# Patient Record
Sex: Female | Born: 1965 | Hispanic: Yes | Marital: Single
Health system: Southern US, Community
[De-identification: ages and names within clinical notes are randomized; demographics above are authoritative.]

---

## 2004-04-04 ENCOUNTER — Emergency Department: Payer: Self-pay | Admitting: Emergency Medicine

## 2016-09-11 ENCOUNTER — Emergency Department
Admission: EM | Admit: 2016-09-11 | Discharge: 2016-09-11 | Disposition: A | Discharge status: Home / Self Care | Payer: BLUE CROSS/BLUE SHIELD | Attending: Emergency Medicine | Admitting: Emergency Medicine

## 2016-09-11 ENCOUNTER — Encounter: Payer: Self-pay | Admitting: Emergency Medicine

## 2016-09-11 ENCOUNTER — Emergency Department: Payer: BLUE CROSS/BLUE SHIELD

## 2016-09-11 DIAGNOSIS — M79642 Pain in left hand: Secondary | ICD-10-CM | POA: Insufficient documentation

## 2016-09-11 DIAGNOSIS — R079 Chest pain, unspecified: Secondary | ICD-10-CM | POA: Insufficient documentation

## 2016-09-11 DIAGNOSIS — Z79899 Other long term (current) drug therapy: Secondary | ICD-10-CM | POA: Diagnosis not present

## 2016-09-11 DIAGNOSIS — R2232 Localized swelling, mass and lump, left upper limb: Secondary | ICD-10-CM | POA: Diagnosis present

## 2016-09-11 LAB — CBC
HEMATOCRIT: 35.5 % (ref 35.0–47.0)
HEMOGLOBIN: 12.2 g/dL (ref 12.0–16.0)
MCH: 28.2 pg (ref 26.0–34.0)
MCHC: 34.4 g/dL (ref 32.0–36.0)
MCV: 82 fL (ref 80.0–100.0)
Platelets: 228 10*3/uL (ref 150–440)
RBC: 4.33 MIL/uL (ref 3.80–5.20)
RDW: 14.2 % (ref 11.5–14.5)
WBC: 8.9 10*3/uL (ref 3.6–11.0)

## 2016-09-11 LAB — BASIC METABOLIC PANEL
ANION GAP: 8 (ref 5–15)
BUN: 12 mg/dL (ref 6–20)
CHLORIDE: 104 mmol/L (ref 101–111)
CO2: 25 mmol/L (ref 22–32)
Calcium: 8.9 mg/dL (ref 8.9–10.3)
Creatinine, Ser: 0.6 mg/dL (ref 0.44–1.00)
GFR calc Af Amer: >60 mL/min (ref >60)
GLUCOSE: 118 mg/dL — AB (ref 65–99)
POTASSIUM: 3.9 mmol/L (ref 3.5–5.1)
Sodium: 137 mmol/L (ref 135–145)

## 2016-09-11 LAB — FIBRIN DERIVATIVES D-DIMER (ARMC ONLY): FIBRIN DERIVATIVES D-DIMER (ARMC): 448.17 (ref 0.00–499.00)

## 2016-09-11 LAB — TROPONIN I: Troponin I: <0.03 ng/mL (ref <0.03)

## 2016-09-11 MED ORDER — LABETALOL HCL 5 MG/ML IV SOLN: 10.0000 mg | Freq: Once | INTRAVENOUS | Status: DC

## 2016-09-11 NOTE — Discharge Instructions (Signed)
Please seek medical attention for any high fevers, chest pain, shortness of breath, change in behavior, persistent vomiting, bloody stool or any other new or concerning symptoms.  

## 2016-09-11 NOTE — ED Provider Notes (Signed)
[facility]   ____________________________________________   I have reviewed the triage vital signs and the nursing notes.   HISTORY  Chief Complaint Headache; Chest Pain; and Hand Pain   History limited by: Language [facility]   HPI Cindy Gray is a 51 y.o. female who presents to the emergency department today with primary concern for left hand swelling. The patient states that the left hand swollen and is been going on for the past few days. She does have pain. It is located primarily around the hand into her mid left forearm. The patient denies any trauma to her hand. She states that there is more pain when she tries to reason. The patient had secondary complaints of headache. This has been going on for quite some time. She has been working with her primary care doctor about this. She takes ibuprofen 800s whichtemporarily helps the pain. Additionally the patient is been having chest pain. She describes it as sharp. It is located in the left chest. Patient denies history of hand swelling in the past.  No past medical history on file.  There are no active problems to display for this patient.   No past surgical history on file.  Prior to Admission medications   Medication Sig Start Date End Date Taking? Authorizing Provider  gabapentin (NEURONTIN) 300 MG capsule Take 300 mg by mouth 2 (two) times daily.   Yes [provider]  metFORMIN (GLUCOPHAGE) 500 MG tablet Take 500 mg by mouth 2 (two) times daily with a meal.   Yes [provider]  omeprazole (PRILOSEC) 20 MG capsule Take 20 mg by mouth daily.   Yes [provider]  acetaminophen (TYLENOL 8 HOUR ARTHRITIS PAIN) 650 MG CR tablet Take 650 mg by mouth every 8 (eight) hours as needed for pain.    [provider]  ibuprofen (ADVIL,MOTRIN) 800 MG tablet Take 800 mg by mouth every 8 (eight) hours as needed.     [provider]    Allergies Aspirin  No family history on file.  Social History Social History  Substance Use Topics  . Smoking status: Not on file  . Smokeless tobacco: Not on file  . Alcohol use Not on file    Review of Systems Constitutional: No fever/chills Eyes: No visual changes. ENT: No sore throat. Cardiovascular: Positive for chest pain.  Respiratory: Denies shortness of breath. Gastrointestinal: Positive for diarrhea.  Genitourinary: Negative for dysuria. Musculoskeletal: Positive for left arm pain.  Skin: Negative for rash. Neurological: Positive for headache.   ____________________________________________   PHYSICAL EXAM:  VITAL SIGNS: ED Triage Vitals  Enc Vitals Group     BP 09/11/16 1607 136/82     Pulse Rate 09/11/16 1607 87     Resp 09/11/16 1607 20     Temp 09/11/16 1607 98.2 F (36.8 C)     Temp Source 09/11/16 1607 Oral     SpO2 09/11/16 1607 98 %     Weight --      Height --      Head Circumference --      Peak Flow --      Pain Score 09/11/16 1611 9   Constitutional: Alert and oriented. Somewhat anxious appearing.  Eyes: Conjunctivae are normal.  ENT   Head: Normocephalic and atraumatic.   Nose: No congestion/rhinnorhea.   Mouth/Throat: Mucous membranes are moist.   Neck: No stridor. Hematological/Lymphatic/Immunilogical: No cervical lymphadenopathy. Cardiovascular: Normal rate, regular rhythm.  No murmurs, rubs, or gallops.  Respiratory: Normal respiratory effort without tachypnea nor retractions. Breath sounds are clear and equal bilaterally. No wheezes/rales/rhonchi. Gastrointestinal: Soft and non tender. No rebound. No guarding.  Genitourinary: Deferred Musculoskeletal: Mild left hand swelling. Non pitting. RP 2+. Neurologic:  Normal speech and language. No gross focal neurologic deficits are appreciated.  Skin:  Skin is warm, dry and intact. No rash noted. Psychiatric: Anxious.    ____________________________________________    LABS (pertinent positives/negatives)  Labs Reviewed  BASIC METABOLIC PANEL - Abnormal; Notable for the following:       Result Value   Glucose, Bld 118 (*)    All other components within normal limits  CBC  TROPONIN I  FIBRIN DERIVATIVES D-DIMER ([facility] ONLY)     ____________________________________________   EKG  I, Phineas Semen, attending physician, personally viewed and interpreted this EKG  EKG Time: 1619 Rate: 88 Rhythm: normal sinus rhythm Axis: normal Intervals: qtc 442 QRS: narrow ST changes: no st elevation Impression: normal ekg   ____________________________________________    RADIOLOGY  CXR  IMPRESSION: No active cardiopulmonary disease.   ____________________________________________   PROCEDURES  Procedures  ____________________________________________   INITIAL IMPRESSION / ASSESSMENT AND PLAN / ED COURSE  Pertinent labs & imaging results that were available during my care of the patient were reviewed by me and considered in my medical decision making (see chart for details).  Patient presented to the emergency department today because of concerns for left hand swelling. Patient did have some swelling around the hand. No erythema or warmth. D-dimer was less than 500. Patient had a good radial pulse. This point unclear etiology although I do wonder if arthritis is possible causes. 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 She had a secondary complaint of some chest pain. Plan was negative. Given the length of symptoms and do not think a second troponin is necessary at this time. While patient follow-up with her primary care.  ____________________________________________   FINAL CLINICAL IMPRESSION(S) / ED DIAGNOSES  Final diagnoses:  Left hand pain  Chest pain, unspecified type     Note: This dictation was prepared with Dragon dictation. Any transcriptional  errors that result from this process are unintentional     Phineas Semen, MD 09/11/16 2042

## 2016-09-11 NOTE — ED Notes (Signed)
Pt states her swelling and pain have improved. Resting with family at the bedside. No distress noted.

## 2016-09-11 NOTE — ED Triage Notes (Signed)
Pt presents with left hand pain, chest pain and headache since last Thursday. Swelling noted to left hand.

## 2016-09-11 NOTE — ED Notes (Signed)
Patient discharged to home per MD order. Patient in stable condition, and deemed medically cleared by ED provider for discharge. Discharge instructions reviewed with patient/family using "Teach Back"; verbalized understanding of medication education and administration, and information about follow-up care. Denies further concerns. ° °

## 2017-03-06 ENCOUNTER — Other Ambulatory Visit: Payer: Self-pay | Admitting: Family Medicine

## 2017-03-06 DIAGNOSIS — Z1231 Encounter for screening mammogram for malignant neoplasm of breast: Secondary | ICD-10-CM

## 2017-03-22 ENCOUNTER — Encounter: Payer: Self-pay | Admitting: Radiology

## 2017-03-22 ENCOUNTER — Ambulatory Visit
Admission: RE | Admit: 2017-03-22 | Discharge: 2017-03-22 | Disposition: A | Discharge status: Home / Self Care | Payer: BLUE CROSS/BLUE SHIELD | Source: Ambulatory Visit | Attending: Family Medicine | Admitting: Family Medicine

## 2017-03-22 DIAGNOSIS — Z1231 Encounter for screening mammogram for malignant neoplasm of breast: Secondary | ICD-10-CM | POA: Insufficient documentation

## 2017-08-19 ENCOUNTER — Ambulatory Visit
Admission: RE | Admit: 2017-08-19 | Discharge: 2017-08-19 | Disposition: A | Discharge status: Home / Self Care | Payer: BLUE CROSS/BLUE SHIELD | Source: Ambulatory Visit | Attending: Family Medicine | Admitting: Family Medicine

## 2017-08-19 ENCOUNTER — Other Ambulatory Visit: Payer: Self-pay | Admitting: Family Medicine

## 2017-08-19 DIAGNOSIS — R05 Cough: Secondary | ICD-10-CM | POA: Diagnosis present

## 2017-08-19 DIAGNOSIS — R059 Cough, unspecified: Secondary | ICD-10-CM

## 2019-04-04 ENCOUNTER — Ambulatory Visit: Payer: Self-pay | Attending: Internal Medicine

## 2019-04-04 DIAGNOSIS — Z23 Encounter for immunization: Secondary | ICD-10-CM

## 2019-04-04 NOTE — Progress Notes (Signed)
   Covid-19 Vaccination Clinic  Name:  Cindy Gray    MRN: [id number] DOB: [birth year]  04/04/2019  Cindy Gray was observed post Covid-19 immunization for 15 minutes without incident. She was provided with Vaccine Information Sheet and instruction to access the V-Safe system.   Cindy Gray was instructed to call 911 with any severe reactions post vaccine: Marland Kitchen Difficulty breathing  . Swelling of face and throat  . A fast heartbeat  . A bad rash all over body  . Dizziness and weakness   Immunizations Administered    Name Date Dose VIS Date Route   Pfizer COVID-19 Vaccine 04/04/2019  5:45 PM 0.3 mL 12/19/2018 Intramuscular   Manufacturer: [employer], [employer]   Lot: [id number]   NDC: [address]-0

## 2019-04-25 ENCOUNTER — Ambulatory Visit: Payer: Self-pay | Attending: Internal Medicine

## 2019-04-25 DIAGNOSIS — Z23 Encounter for immunization: Secondary | ICD-10-CM

## 2019-04-25 NOTE — Progress Notes (Signed)
   Covid-19 Vaccination Clinic  Name:  Karliah Kowalchuk Roman    MRN: [id number] DOB: [birth year]  04/25/2019  Ms. Lomeli Roman was observed post Covid-19 immunization for 15 minutes without incident. She was provided with Vaccine Information Sheet and instruction to access the V-Safe system.   Ms. Valentine Kuechle was instructed to call 911 with any severe reactions post vaccine: Marland Kitchen Difficulty breathing  . Swelling of face and throat  . A fast heartbeat  . A bad rash all over body  . Dizziness and weakness   Immunizations Administered    Name Date Dose VIS Date Route   Pfizer COVID-19 Vaccine 04/25/2019  3:47 PM 0.3 mL 12/19/2018 Intramuscular   Manufacturer: [employer], [employer]   Lot: [id number]   NDC: [address]-5

## 2019-09-23 ENCOUNTER — Emergency Department: Payer: Worker's Compensation

## 2019-09-23 ENCOUNTER — Emergency Department
Admission: EM | Admit: 2019-09-23 | Discharge: 2019-09-23 | Disposition: A | Discharge status: Home / Self Care | Payer: Worker's Compensation | Attending: Emergency Medicine | Admitting: Emergency Medicine

## 2019-09-23 ENCOUNTER — Other Ambulatory Visit: Payer: Self-pay

## 2019-09-23 ENCOUNTER — Encounter: Payer: Self-pay | Admitting: Physician Assistant

## 2019-09-23 DIAGNOSIS — Y990 Civilian activity done for income or pay: Secondary | ICD-10-CM | POA: Insufficient documentation

## 2019-09-23 DIAGNOSIS — S300XXA Contusion of lower back and pelvis, initial encounter: Secondary | ICD-10-CM | POA: Diagnosis not present

## 2019-09-23 DIAGNOSIS — S66911A Strain of unspecified muscle, fascia and tendon at wrist and hand level, right hand, initial encounter: Secondary | ICD-10-CM | POA: Diagnosis not present

## 2019-09-23 DIAGNOSIS — Y9301 Activity, walking, marching and hiking: Secondary | ICD-10-CM | POA: Diagnosis not present

## 2019-09-23 DIAGNOSIS — S6991XA Unspecified injury of right wrist, hand and finger(s), initial encounter: Secondary | ICD-10-CM | POA: Diagnosis present

## 2019-09-23 DIAGNOSIS — Z794 Long term (current) use of insulin: Secondary | ICD-10-CM | POA: Insufficient documentation

## 2019-09-23 DIAGNOSIS — S63501A Unspecified sprain of right wrist, initial encounter: Secondary | ICD-10-CM | POA: Insufficient documentation

## 2019-09-23 DIAGNOSIS — Y9289 Other specified places as the place of occurrence of the external cause: Secondary | ICD-10-CM | POA: Insufficient documentation

## 2019-09-23 DIAGNOSIS — W1839XA Other fall on same level, initial encounter: Secondary | ICD-10-CM | POA: Insufficient documentation

## 2019-09-23 DIAGNOSIS — W19XXXA Unspecified fall, initial encounter: Secondary | ICD-10-CM

## 2019-09-23 NOTE — ED Notes (Signed)
Pt provided with phone to call supervisor to pick her up.

## 2019-09-23 NOTE — Discharge Instructions (Signed)
Follow-up with orthopedics as needed.  Take Tylenol and ibuprofen for pain as needed.  Apply ice to all areas that hurt.

## 2019-09-23 NOTE — ED Triage Notes (Signed)
Pt states she slipped at work at [employer] at 0930 today, falling backwards and injuring hands, back and left leg. Pt ambulatory in no acute distress.

## 2019-09-23 NOTE — ED Provider Notes (Signed)
[facility] Emergency Department Provider Note  ____________________________________________   First MD Initiated Contact with Patient 09/23/19 1131     (approximate)  I have reviewed the triage vital signs and the nursing notes.   HISTORY  Chief Complaint Fall    HPI Vicie Cech Roman is a 54 y.o. female presents emergency department after a fall at work.  She works at [employer].  Said she did not see a box and fell backwards.  Complaining of hand pain and lower back pain.  States she does not feel like she broke anything she is just very sore.  They told her they wanted her to get checked out.    History reviewed. No pertinent past medical history.  There are no problems to display for this patient.   History reviewed. No pertinent surgical history.  Prior to Admission medications   Medication Sig Start Date End Date Taking? Authorizing Provider  acetaminophen (TYLENOL 8 HOUR ARTHRITIS PAIN) 650 MG CR tablet Take 650 mg by mouth every 8 (eight) hours as needed for pain.    [provider]  gabapentin (NEURONTIN) 300 MG capsule Take 300 mg by mouth 2 (two) times daily.    [provider]  ibuprofen (ADVIL,MOTRIN) 800 MG tablet Take 800 mg by mouth every 8 (eight) hours as needed.    [provider]  insulin detemir (LEVEMIR) 100 UNIT/ML injection Inject into the skin at bedtime.    [provider]  metFORMIN (GLUCOPHAGE) 500 MG tablet Take 500 mg by mouth 2 (two) times daily with a meal.    [provider]  omeprazole (PRILOSEC) 20 MG capsule Take 20 mg by mouth daily.    [provider]    Allergies Aspirin  Family History  Problem Relation Age of Onset  . Breast cancer Sister   . Breast cancer Maternal Grandmother   . Breast cancer Paternal Grandmother     Social History Social History   Tobacco Use  . Smoking status: Not on file  Substance Use Topics  . Alcohol use: Not on  file  . Drug use: Not on file    Review of Systems  Constitutional: No fever/chills Eyes: No visual changes. ENT: No sore throat. Respiratory: Denies cough Cardiovascular: Denies chest pain Gastrointestinal: Denies abdominal pain Genitourinary: Negative for dysuria. Musculoskeletal: Positive for back pain and wrist pain. Skin: Negative for rash. Psychiatric: no mood changes,     ____________________________________________   PHYSICAL EXAM:  VITAL SIGNS: ED Triage Vitals  Enc Vitals Group     BP 09/23/19 1033 127/60     Pulse Rate 09/23/19 1033 89     Resp 09/23/19 1033 16     Temp 09/23/19 1035 98.5 F (36.9 C)     Temp Source 09/23/19 1033 Oral     SpO2 09/23/19 1033 100 %     Weight 09/23/19 1033 252 lb (114.3 kg)     Height 09/23/19 1033 5\' 3"  (1.6 m)     Head Circumference --      Peak Flow --      Pain Score 09/23/19 1033 7     Pain Loc --      Pain Edu? --      Excl. in GC? --     Constitutional: Alert and oriented. Well appearing and in no acute distress. Eyes: Conjunctivae are normal.  Head: Atraumatic. Nose: No congestion/rhinnorhea. Mouth/Throat: Mucous membranes are moist.  Neck:  supple no lymphadenopathy noted Cardiovascular: Normal rate,  regular rhythm.  Respiratory: Normal respiratory effort.  No retractions,  GU: deferred Musculoskeletal: FROM all extremities, warm and well perfused, patient is able to lean backwards and forwards without difficulty, right wrist is tender, left wrist is not, lumbar spine is tender towards the left SI joint, patient is able to walk without difficulty, neurovascular intact, strength is 5/5 in lower extremities Neurologic:  Normal speech and language.  Skin:  Skin is warm, dry and intact. No rash noted. Psychiatric: Mood and affect are normal. Speech and behavior are normal.  ____________________________________________   LABS (all labs ordered are listed, but only abnormal results are displayed)  Labs  Reviewed - No data to display ____________________________________________   ____________________________________________  RADIOLOGY  X-ray of the right wrist and lumbar spine  ____________________________________________   PROCEDURES  Procedure(s) performed: No  Procedures    ____________________________________________   INITIAL IMPRESSION / ASSESSMENT AND PLAN / ED COURSE  Pertinent labs & imaging results that were available during my care of the patient were reviewed by me and considered in my medical decision making (see chart for details).   Patient is a 54 year old female presents emergency department from work.  She is complaining of a fall at work.  See HPI.  Physical exam shows patient to be tender in the right wrist and slightly tender along the left lower side of the lower back.  Due to being [employer]. we will x-ray the right wrist and lumbar spine  X-rays are negative for any acute abnormality.  Did explain findings to the patient.  She is placed in a wrist brace on the right side for comfort.  She is told to take Tylenol and ibuprofen.  Follow-up with orthopedics if not improved in 1 week.  Instructed her employer that she should wear the wrist brace for 1 week.  If not improving she would need to see Ortho.  Patient was discharged in stable condition.    Willistine Ferrall Roman was evaluated in Emergency Department on 09/23/2019 for the symptoms described in the history of present illness. She was evaluated in the context of the global COVID-19 pandemic, which necessitated consideration that the patient might be at risk for infection with the SARS-CoV-2 virus that causes COVID-19. Institutional protocols and algorithms that pertain to the evaluation of patients at risk for COVID-19 are in a state of rapid change based on information released by regulatory bodies including the CDC and federal and state organizations. These policies and algorithms were followed during  the patient's care in the ED.    As part of my medical decision making, I reviewed the following data within the electronic MEDICAL RECORD NUMBER History obtained from family, Nursing notes reviewed and incorporated, Old chart reviewed, Radiograph reviewed , Notes from prior ED visits and [place] Controlled Substance Database  ____________________________________________   FINAL CLINICAL IMPRESSION(S) / ED DIAGNOSES  Final diagnoses:  Fall, initial encounter  Sprain and strain of right wrist  Lumbar contusion, initial encounter      NEW MEDICATIONS STARTED DURING THIS VISIT:  New Prescriptions   No medications on file     Note:  This document was prepared using Dragon voice recognition software and may include unintentional dictation errors.    Faythe Ghee, PA-C 09/23/19 1303    Sharman Cheek, MD 09/23/19 [phone]

## 2019-09-23 NOTE — ED Notes (Signed)
See triage note  Presents s/pm fall  States she fell backwards  Having pain to left lower back/leg and right wrist  Ambulates well  She works at [employer]

## 2020-07-07 ENCOUNTER — Other Ambulatory Visit: Payer: Self-pay

## 2020-07-07 ENCOUNTER — Emergency Department: Payer: Worker's Compensation

## 2020-07-07 ENCOUNTER — Encounter: Payer: Self-pay | Admitting: Emergency Medicine

## 2020-07-07 ENCOUNTER — Emergency Department
Admission: EM | Admit: 2020-07-07 | Discharge: 2020-07-07 | Disposition: A | Discharge status: Home / Self Care | Payer: Worker's Compensation | Attending: Emergency Medicine | Admitting: Emergency Medicine

## 2020-07-07 DIAGNOSIS — M25562 Pain in left knee: Secondary | ICD-10-CM | POA: Insufficient documentation

## 2020-07-07 DIAGNOSIS — W010XXA Fall on same level from slipping, tripping and stumbling without subsequent striking against object, initial encounter: Secondary | ICD-10-CM | POA: Diagnosis not present

## 2020-07-07 DIAGNOSIS — Y990 Civilian activity done for income or pay: Secondary | ICD-10-CM | POA: Diagnosis not present

## 2020-07-07 DIAGNOSIS — M549 Dorsalgia, unspecified: Secondary | ICD-10-CM | POA: Diagnosis not present

## 2020-07-07 MED ORDER — KETOROLAC TROMETHAMINE 30 MG/ML IJ SOLN
30.0000 mg | Freq: Once | INTRAMUSCULAR | Status: AC
  Administered 2020-07-07: 30 mg via INTRAMUSCULAR
  Filled 2020-07-07: qty 1

## 2020-07-07 MED ORDER — MELOXICAM 7.5 MG PO TABS: 7.5000 mg | ORAL_TABLET | Freq: Every day | ORAL | 1 refills | Status: AC

## 2020-07-07 NOTE — ED Notes (Signed)
See triage note  States she slipped and fell at work  Having pain to left knee and lower back

## 2020-07-07 NOTE — ED Triage Notes (Signed)
Pt comes into the ED via POV filing workers comp for a fall at work.  Pt c/o left knee pain and right upper back pain from the fall.  Pt denies hitting her head.  Pt in NAD at this timewwith no deformities noted.

## 2020-07-07 NOTE — Discharge Instructions (Addendum)
Take Meloxicam once daily for pain and inflammation.  

## 2020-07-07 NOTE — ED Provider Notes (Signed)
[facility]-EMERGENCY DEPARTMENT  ____________________________________________  Time seen: Approximately 4:00 PM  I have reviewed the triage vital signs and the nursing notes.   HISTORY  Chief Complaint Back Pain and Leg Pain   Historian Patient     HPI Cindy Gray is a 55 y.o. female presents to the emergency department with left knee pain after a mechanical fall at work.  Patient reports that she slipped and hyperflexed her left knee.  She denies hitting her head or her neck.  No numbness or tingling in the lower extremities.  No chest pain, chest tightness or abdominal pain.  No similar injuries in the past.   History reviewed. No pertinent past medical history.   Immunizations up to date:  Yes.     History reviewed. No pertinent past medical history.  There are no problems to display for this patient.   History reviewed. No pertinent surgical history.  Prior to Admission medications   Medication Sig Start Date End Date Taking? Authorizing Provider  meloxicam (MOBIC) 7.5 MG tablet Take 1 tablet (7.5 mg total) by mouth daily for 7 days. 07/07/20 07/14/20 Yes Orvil Feil, PA-C  acetaminophen (TYLENOL 8 HOUR ARTHRITIS PAIN) 650 MG CR tablet Take 650 mg by mouth every 8 (eight) hours as needed for pain.    [provider]  gabapentin (NEURONTIN) 300 MG capsule Take 300 mg by mouth 2 (two) times daily.    [provider]  ibuprofen (ADVIL,MOTRIN) 800 MG tablet Take 800 mg by mouth every 8 (eight) hours as needed.    [provider]  insulin detemir (LEVEMIR) 100 UNIT/ML injection Inject into the skin at bedtime.    [provider]  metFORMIN (GLUCOPHAGE) 500 MG tablet Take 500 mg by mouth 2 (two) times daily with a meal.    [provider]    Allergies Aspirin  Family History  Problem Relation Age of Onset   Breast cancer Sister    Breast cancer Maternal Grandmother    Breast cancer Paternal Grandmother     Social  History     Review of Systems  Constitutional: No fever/chills Eyes:  No discharge ENT: No upper respiratory complaints. Respiratory: no cough. No SOB/ use of accessory muscles to breath Gastrointestinal:   No nausea, no vomiting.  No diarrhea.  No constipation. Musculoskeletal: Patient has left knee pain.  Skin: Negative for rash, abrasions, lacerations, ecchymosis.    ____________________________________________   PHYSICAL EXAM:  VITAL SIGNS: ED Triage Vitals  Enc Vitals Group     BP 07/07/20 1454 134/72     Pulse Rate 07/07/20 1454 96     Resp 07/07/20 1454 17     Temp 07/07/20 1454 98.4 F (36.9 C)     Temp Source 07/07/20 1454 Oral     SpO2 07/07/20 1454 94 %     Weight 07/07/20 1451 251 lb 5.2 oz (114 kg)     Height 07/07/20 1451 5\' 3"  (1.6 m)     Head Circumference --      Peak Flow --      Pain Score 07/07/20 1451 9     Pain Loc --      Pain Edu? --      Excl. in GC? --      Constitutional: Alert and oriented. Well appearing and in no acute distress. Eyes: Conjunctivae are normal. PERRL. EOMI. Head: Atraumatic. ENT: Cardiovascular: Normal rate, regular rhythm. Normal S1 and S2.  Good peripheral circulation. Respiratory: Normal respiratory effort without  tachypnea or retractions. Lungs CTAB. Good air entry to the bases with no decreased or absent breath sounds Gastrointestinal: Bowel sounds x 4 quadrants. Soft and nontender to palpation. No guarding or rigidity. No distention. Musculoskeletal: Full range of motion to all extremities. No obvious deformities noted Neurologic:  Normal for age. No gross focal neurologic deficits are appreciated.  Skin:  Skin is warm, dry and intact. No rash noted. Psychiatric: Mood and affect are normal for age. Speech and behavior are normal.   ____________________________________________   LABS (all labs ordered are listed, but only abnormal results are displayed)  Labs Reviewed - No data to  display ____________________________________________  EKG   ____________________________________________  RADIOLOGY Geraldo Pitter, personally viewed and evaluated these images (plain radiographs) as part of my medical decision making, as well as reviewing the written report by the radiologist.  DG Knee Complete 4 Views Left  Result Date: 07/07/2020 CLINICAL DATA:  Knee pain post fall EXAM: LEFT KNEE - COMPLETE 4+ VIEW COMPARISON:  None. FINDINGS: No fracture or malalignment. Mild tricompartment arthritis. No substantial knee effusion. IMPRESSION: No acute osseous abnormality. Electronically Signed   By: Jasmine Pang M.D.   On: 07/07/2020 15:40    ____________________________________________    PROCEDURES  Procedure(s) performed:     Procedures     Medications  ketorolac (TORADOL) 30 MG/ML injection 30 mg (has no administration in time range)     ____________________________________________   INITIAL IMPRESSION / ASSESSMENT AND PLAN / ED COURSE  Pertinent labs & imaging results that were available during my care of the patient were reviewed by me and considered in my medical decision making (see chart for details).      Assessment and plan Left knee pain 55 year old female presents to the emergency department with acute left knee pain after a mechanical fall.  Vital signs are reassuring at triage.  On physical exam, patient was alert, active and nontoxic-appearing with no neurodeficits noted.  No bony abnormality was visualized on x-ray.  Patient was given Toradol in the emergency department for pain and discharged with meloxicam.  Return precautions were given to return with new or worsening symptoms.     ____________________________________________  FINAL CLINICAL IMPRESSION(S) / ED DIAGNOSES  Final diagnoses:  Acute pain of left knee      NEW MEDICATIONS STARTED DURING THIS VISIT:  ED Discharge Orders          Ordered    meloxicam (MOBIC) 7.5  MG tablet  Daily        07/07/20 1556                This chart was dictated using voice recognition software/Dragon. Despite best efforts to proofread, errors can occur which can change the meaning. Any change was purely unintentional.     Orvil Feil, PA-C 07/07/20 2148    Shaune Pollack, MD 07/10/20 1540

## 2020-10-28 ENCOUNTER — Other Ambulatory Visit: Payer: Self-pay | Admitting: Family Medicine

## 2020-10-31 ENCOUNTER — Other Ambulatory Visit: Payer: Self-pay | Admitting: Family Medicine

## 2020-10-31 DIAGNOSIS — R1909 Other intra-abdominal and pelvic swelling, mass and lump: Secondary | ICD-10-CM

## 2022-09-13 IMAGING — CR DG KNEE COMPLETE 4+V*L*
1 series · 4 of 4 positions shown · non-contrast
Comparison: None.

CLINICAL DATA: Knee pain post fall

EXAM:
LEFT KNEE - COMPLETE 4+ VIEW

[Series 1: dg knee complete 4 views left · 0.14mm/px · 4 of 4 slices shown]
[im 1/4]
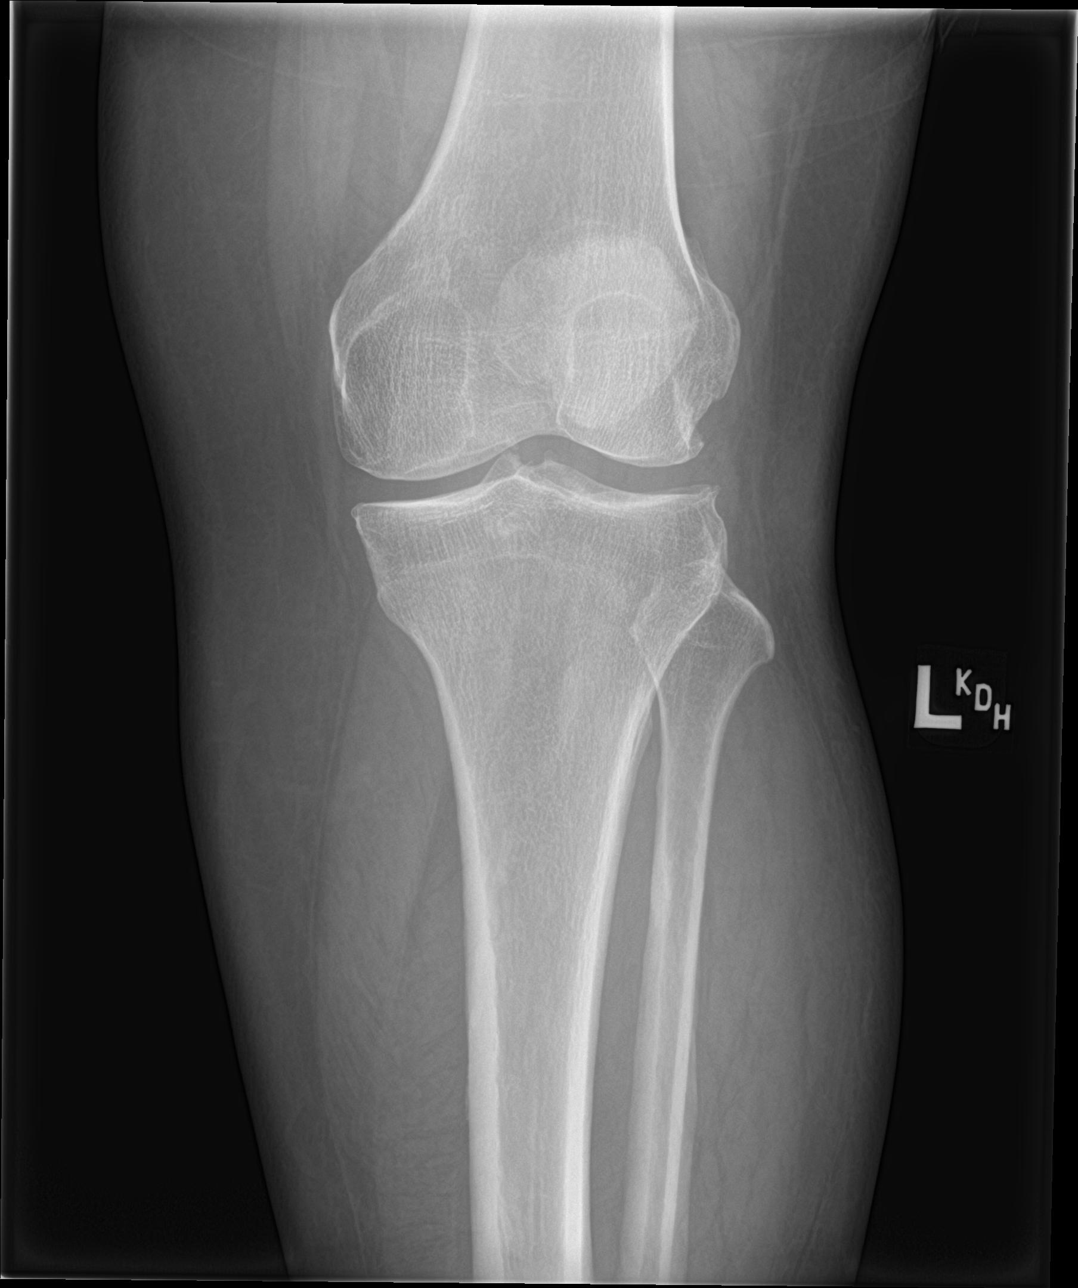
[im 2/4]
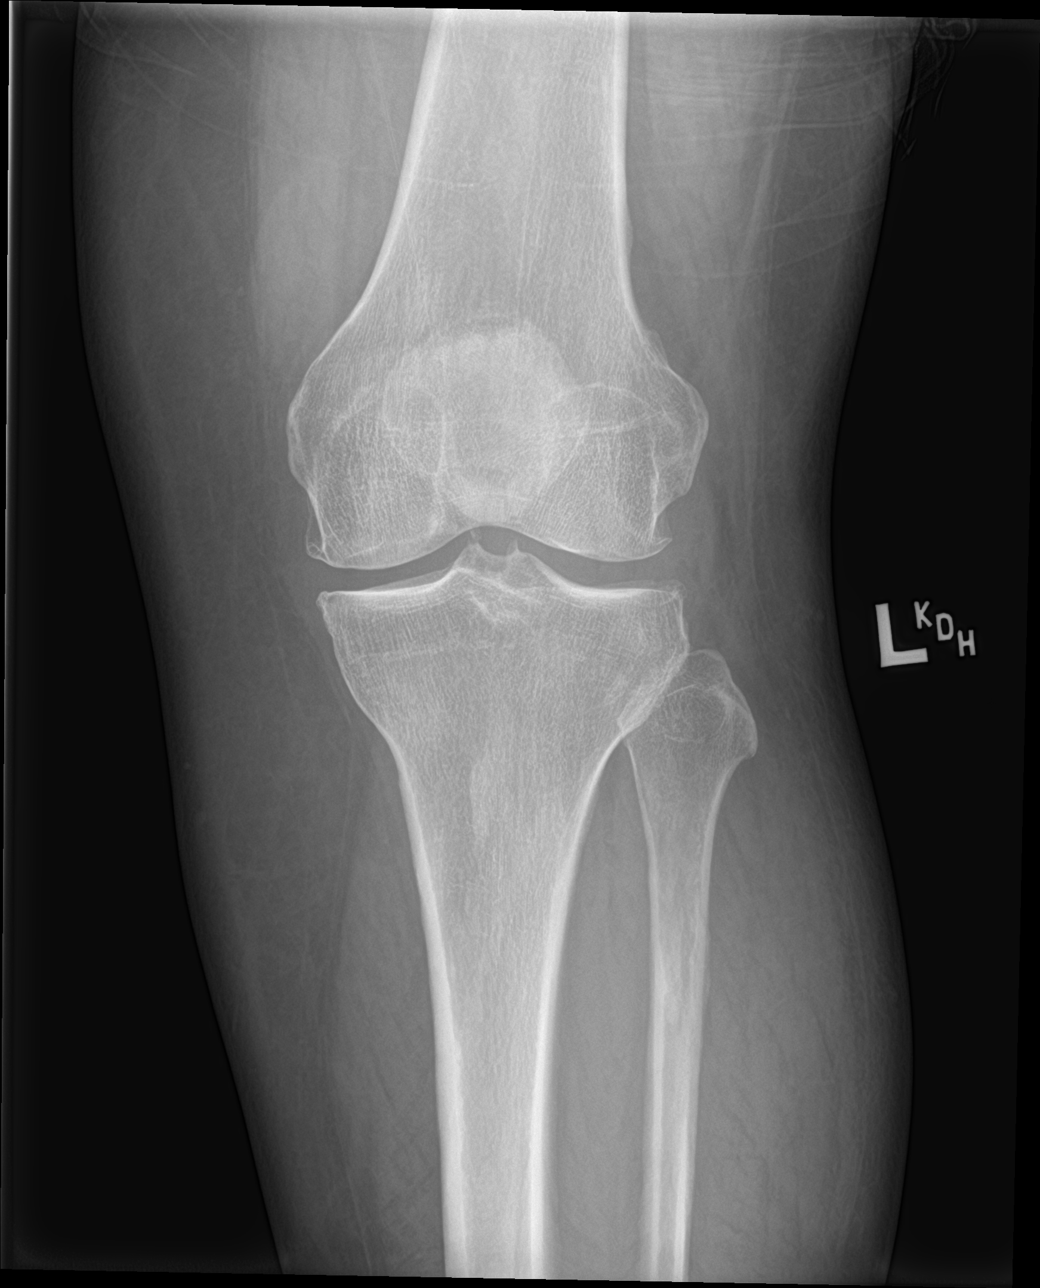
[im 3/4]
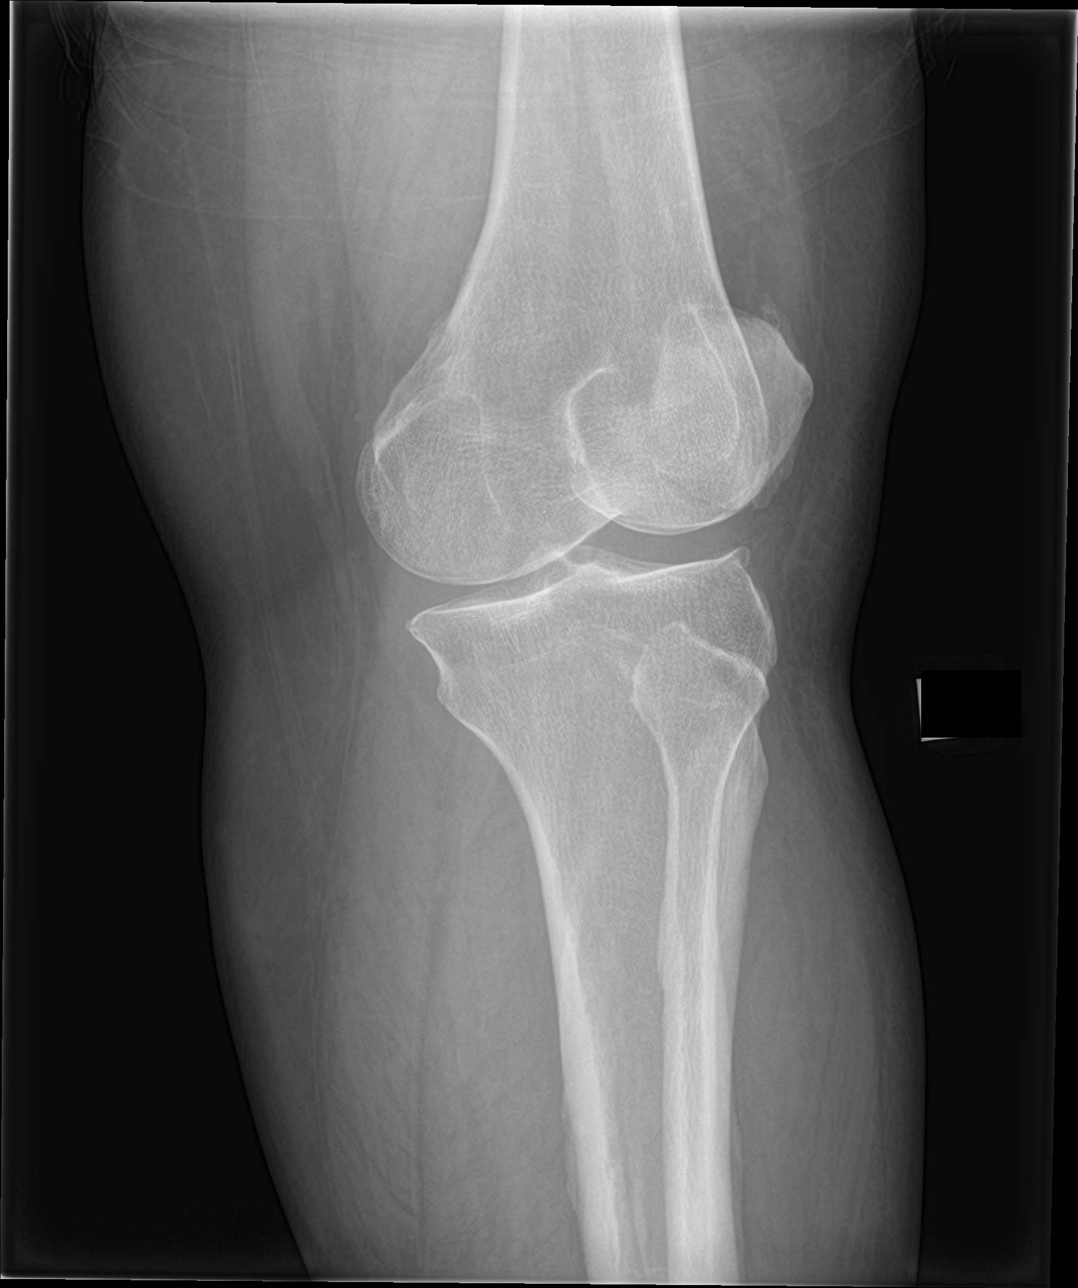
[im 4/4]
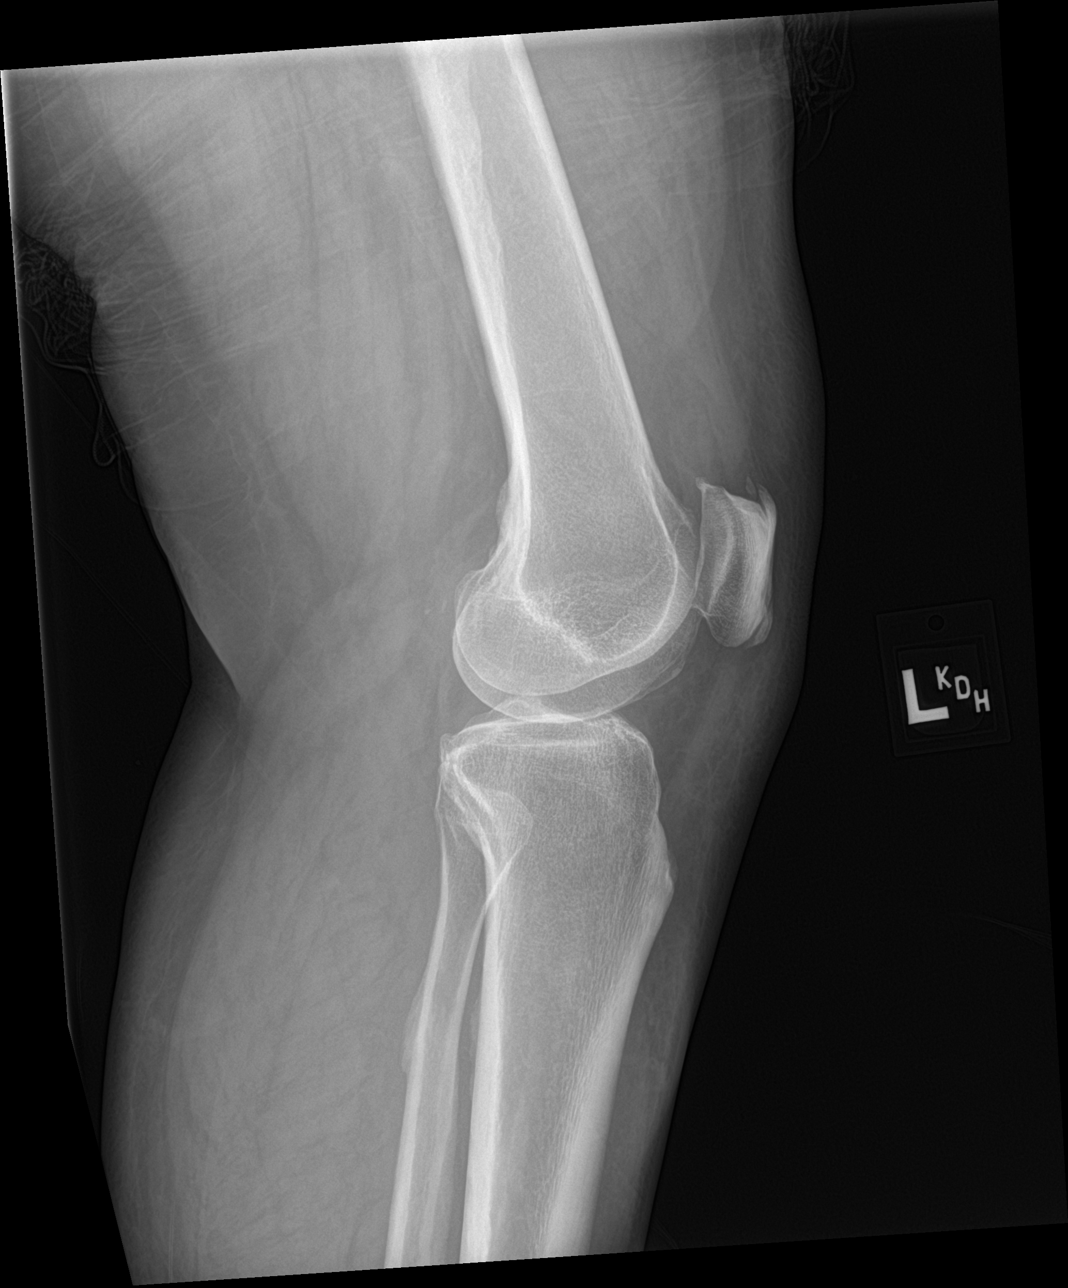

[4 of 4 positions shown; findings below may reference images not displayed]

FINDINGS: No fracture or malalignment. Mild tricompartment arthritis. No
substantial knee effusion.
IMPRESSION: No acute osseous abnormality.
